# Patient Record
Sex: Male | Born: 1980 | Race: Black or African American | Hispanic: No | Marital: Married | Smoking: Never smoker
Health system: Southern US, Community
[De-identification: ages and names within clinical notes are randomized; demographics above are authoritative.]

## PROBLEM LIST (undated history)

## (undated) HISTORY — PX: MOUTH SURGERY: SHX715

---

## 2007-07-29 ENCOUNTER — Emergency Department (HOSPITAL_COMMUNITY): Admission: EM | Admit: 2007-07-29 | Discharge: 2007-07-29 | Payer: Self-pay | Admitting: Emergency Medicine

## 2011-06-17 LAB — HIV ANTIBODY (ROUTINE TESTING W REFLEX): HIV: NONREACTIVE

## 2011-06-17 LAB — GC/CHLAMYDIA PROBE AMP, URINE: Chlamydia, Swab/Urine, PCR: POSITIVE — AB

## 2011-06-17 LAB — RPR: RPR Ser Ql: NONREACTIVE

## 2011-09-11 ENCOUNTER — Emergency Department (INDEPENDENT_AMBULATORY_CARE_PROVIDER_SITE_OTHER)
Admission: EM | Admit: 2011-09-11 | Discharge: 2011-09-11 | Disposition: A | Discharge status: Home / Self Care | Payer: 59 | Source: Home / Self Care | Attending: Family Medicine | Admitting: Family Medicine

## 2011-09-11 DIAGNOSIS — B9789 Other viral agents as the cause of diseases classified elsewhere: Secondary | ICD-10-CM

## 2011-09-11 DIAGNOSIS — B349 Viral infection, unspecified: Secondary | ICD-10-CM

## 2011-09-11 MED ORDER — HYDROCODONE-ACETAMINOPHEN 5-500 MG PO TABS
1.0000 | ORAL_TABLET | Freq: Four times a day (QID) | ORAL | Status: AC | PRN
Start: 2011-09-11 — End: 2011-09-21

## 2011-09-11 MED ORDER — LOPERAMIDE HCL 2 MG PO CAPS: 2.0000 mg | ORAL_CAPSULE | Freq: Four times a day (QID) | ORAL | Status: AC | PRN

## 2011-09-11 MED ORDER — ONDANSETRON HCL 4 MG PO TABS: 4.0000 mg | ORAL_TABLET | Freq: Four times a day (QID) | ORAL | Status: AC

## 2011-09-11 NOTE — ED Notes (Signed)
Pt states had diarrhea brom Monday through Wednesday.  States he would have low back pain with each diarrhea episode.  States no diarrhea today but having constant low back pain.  Denies n/v or fever.  Took Pepto Bismol x 2 yesterday

## 2011-09-15 NOTE — ED Provider Notes (Signed)
History     CSN: [id number]  Arrival date & time 09/11/11  1412   First MD Initiated Contact with Patient 09/11/11 1603      Chief Complaint  Patient presents with  . Back Pain  . Diarrhea    (Consider location/radiation/quality/duration/timing/severity/associated sxs/prior treatment) HPI Comments: 31 y/o male no significant PMH. Here c/o liquid diarrhea about 3-4 times a day associated with muscle aches in low back. No cough, had cold like symptoms last week, congestion resolved. Has had nausea but no vomiting. No diarrhea today denies dysuria, frequency or abdominal pain. States he gets abdominal cramps just before a bowel movement that resolves after defecation. No blood or mucus in stools. Drinking water well. No fever.   History reviewed. No pertinent past medical history.  History reviewed. No pertinent past surgical history.  History reviewed. No pertinent family history.  History  Substance Use Topics  . Smoking status: Never Smoker   . Smokeless tobacco: Not on file  . Alcohol Use: No      Review of Systems  Constitutional: Positive for appetite change. Negative for fever and chills.  HENT: Negative for congestion and rhinorrhea.   Respiratory: Negative for cough and shortness of breath.   Cardiovascular: Negative for chest pain, palpitations and leg swelling.  Gastrointestinal: Positive for nausea and diarrhea. Negative for vomiting, abdominal pain, constipation and blood in stool.  Genitourinary: Negative for dysuria, frequency and hematuria.  Musculoskeletal: Positive for back pain.  Skin: Negative for rash.    Allergies  Review of patient's allergies indicates no known allergies.  Home Medications   Current Outpatient Rx  Name Route Sig Dispense Refill  . HYDROCODONE-ACETAMINOPHEN 5-500 MG PO TABS Oral Take 1-2 tablets by mouth every 6 (six) hours as needed for pain. 15 tablet 0  . LOPERAMIDE HCL 2 MG PO CAPS Oral Take 1 capsule (2 mg total) by mouth 4  (four) times daily as needed for diarrhea or loose stools. 12 capsule 0  . ONDANSETRON HCL 4 MG PO TABS Oral Take 1 tablet (4 mg total) by mouth every 6 (six) hours. 10 tablet 0    BP 127/69  Pulse 63  Temp(Src) 98.4 F (36.9 C) (Oral)  Resp 18  SpO2 100%  Physical Exam  Nursing note and vitals reviewed. Constitutional: He is oriented to person, place, and time. He appears well-developed and well-nourished. No distress.  HENT:  Head: Normocephalic and atraumatic.  Nose: Nose normal.  Mouth/Throat: Oropharynx is clear and moist. No oropharyngeal exudate.  Eyes: Conjunctivae and EOM are normal. Pupils are equal, round, and reactive to light. No scleral icterus.  Neck: Normal range of motion. Neck supple. No thyromegaly present.  Cardiovascular: Normal rate, regular rhythm and normal heart sounds.   Pulmonary/Chest: Effort normal and breath sounds normal.  Abdominal: Soft. Bowel sounds are normal. He exhibits no distension and no mass. There is no tenderness. There is no rebound and no guarding.  Musculoskeletal:       Paravertebral tenderness in lumbar area. Otherwise normal back exam with fair range of movement. Negative straight leg test. Normal low extremity DTRs.  Lymphadenopathy:    He has no cervical adenopathy.  Neurological: He is alert and oriented to person, place, and time.  Skin: No rash noted.    ED Course  Procedures (including critical care time)  Labs Reviewed - No data to display No results found.   1. Viral syndrome       MDM  Symptomatic treatment.  Sharin Grave, MD 09/15/11 1112

## 2016-09-05 ENCOUNTER — Emergency Department: Payer: No Typology Code available for payment source

## 2016-09-05 ENCOUNTER — Encounter: Payer: Self-pay | Admitting: Emergency Medicine

## 2016-09-05 ENCOUNTER — Emergency Department
Admission: EM | Admit: 2016-09-05 | Discharge: 2016-09-05 | Disposition: A | Discharge status: Home / Self Care | Payer: No Typology Code available for payment source | Attending: Emergency Medicine | Admitting: Emergency Medicine

## 2016-09-05 DIAGNOSIS — Y9389 Activity, other specified: Secondary | ICD-10-CM | POA: Diagnosis not present

## 2016-09-05 DIAGNOSIS — S161XXA Strain of muscle, fascia and tendon at neck level, initial encounter: Secondary | ICD-10-CM

## 2016-09-05 DIAGNOSIS — Z5321 Procedure and treatment not carried out due to patient leaving prior to being seen by health care provider: Secondary | ICD-10-CM | POA: Insufficient documentation

## 2016-09-05 DIAGNOSIS — Y92410 Unspecified street and highway as the place of occurrence of the external cause: Secondary | ICD-10-CM | POA: Diagnosis not present

## 2016-09-05 DIAGNOSIS — S20219A Contusion of unspecified front wall of thorax, initial encounter: Secondary | ICD-10-CM

## 2016-09-05 DIAGNOSIS — Y999 Unspecified external cause status: Secondary | ICD-10-CM | POA: Diagnosis not present

## 2016-09-05 DIAGNOSIS — S0990XA Unspecified injury of head, initial encounter: Secondary | ICD-10-CM | POA: Diagnosis present

## 2016-09-05 MED ORDER — IBUPROFEN 600 MG PO TABS: 600.0000 mg | ORAL_TABLET | Freq: Three times a day (TID) | ORAL | 0 refills | Status: AC | PRN

## 2016-09-05 MED ORDER — METHOCARBAMOL 500 MG PO TABS: 500.0000 mg | ORAL_TABLET | Freq: Four times a day (QID) | ORAL | 0 refills | Status: AC

## 2016-09-05 MED ORDER — TETANUS-DIPHTH-ACELL PERTUSSIS 5-2.5-18.5 LF-MCG/0.5 IM SUSP
0.5000 mL | Freq: Once | INTRAMUSCULAR | Status: AC
  Administered 2016-09-05: 0.5 mL via INTRAMUSCULAR
  Filled 2016-09-05: qty 0.5
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
## 2016-09-05 NOTE — Discharge Instructions (Signed)
Moist heat or ice to muscles as needed for soreness. Robaxin as needed for muscle spasms. Ibuprofen 600 mg 3 times a day with food. [facility] if any continued problems.
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
## 2016-09-05 NOTE — ED Provider Notes (Signed)
ED ECG REPORT I, Jene EveryKINNER, Huda Petrey, the attending physician, personally viewed and interpreted this ECG.  Date: 09/05/2016 EKG Time: 7:41 AM Rate: 69 Rhythm: normal sinus rhythm QRS Axis: normal Intervals: normal ST/T Wave abnormalities: normal Conduction Disturbances: none Narrative Interpretation: unremarkable    Jene Everyobert Sherrilyn Nairn, MD 09/05/16 [phone]
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
## 2019-10-02 ENCOUNTER — Other Ambulatory Visit: Payer: Self-pay

## 2019-10-02 DIAGNOSIS — Z20822 Contact with and (suspected) exposure to covid-19: Secondary | ICD-10-CM

## 2019-10-03 LAB — NOVEL CORONAVIRUS, NAA: SARS-CoV-2, NAA: DETECTED — AB

## 2019-10-05 ENCOUNTER — Telehealth: Payer: Self-pay | Admitting: *Deleted

## 2019-10-05 NOTE — Telephone Encounter (Signed)
Patient notified of + COVID result. Patient test due to symptoms: exposure- no symptoms. Patient advised to treat symptoms as needed OTC, contact PCP for follow up and go to ED for trouble breathing ,dehydration or severe weakness. Advised to isolate and safe precautions in the home reviewed. CDC criteria for ending isolation given. Health dept has been  notified.

## 2020-02-07 ENCOUNTER — Ambulatory Visit: Payer: Self-pay | Attending: Internal Medicine

## 2020-02-07 DIAGNOSIS — Z23 Encounter for immunization: Secondary | ICD-10-CM

## 2020-02-07 NOTE — Progress Notes (Signed)
   [id number]  Name:  Xavier Buckley.    MRN: [id number] DOB: 1981-07-26  02/07/2020  Xavier Buckley was observed post Covid-19 immunization for 15 minutes without incident. He was provided with Vaccine Information Sheet and instruction to access the V-Safe system.   Xavier Buckley was instructed to call 911 with any severe reactions post vaccine: Marland Kitchen Difficulty breathing  . Swelling of face and throat  . A fast heartbeat  . A bad rash all over body  . Dizziness and weakness   Immunizations Administered    Name Date Dose VIS Date Route   Pfizer COVID-19 Vaccine 02/07/2020  9:59 AM 0.3 mL 11/02/2018 Intramuscular   Manufacturer: [employer], [employer]   Lot: [id number]   NDC: [address]-5

## 2020-03-05 ENCOUNTER — Ambulatory Visit: Payer: Self-pay | Attending: Internal Medicine

## 2020-03-05 DIAGNOSIS — Z23 Encounter for immunization: Secondary | ICD-10-CM

## 2020-03-05 NOTE — Progress Notes (Signed)
   [id number]  Name:  Xavier Buckley.    MRN: [id number] DOB: [birth year]  03/05/2020  Mr. [name] was observed post Covid-19 immunization for 15 minutes without incident. He was provided with Vaccine Information Sheet and instruction to access the V-Safe system.   Mr. [name] was instructed to call 911 with any severe reactions post vaccine: Marland Kitchen Difficulty breathing  . Swelling of face and throat  . A fast heartbeat  . A bad rash all over body  . Dizziness and weakness   Immunizations Administered    Name Date Dose VIS Date Route   Pfizer COVID-19 Vaccine 03/05/2020 10:12 AM 0.3 mL 11/02/2018 Intramuscular   Manufacturer: [employer], [employer]   Lot: [id number]   NDC: [address]-5
